# Patient Record
Sex: Male | Born: 1945 | Race: Black or African American | Hispanic: No | Marital: Married
Health system: Southern US, Community
[De-identification: ages and names within clinical notes are randomized; demographics above are authoritative.]

## PROBLEM LIST (undated history)

## (undated) DIAGNOSIS — I10 Essential (primary) hypertension: Secondary | ICD-10-CM

---

## 2018-04-23 ENCOUNTER — Emergency Department (HOSPITAL_COMMUNITY): Payer: Medicare Other

## 2018-04-23 ENCOUNTER — Encounter (HOSPITAL_COMMUNITY): Payer: Self-pay | Admitting: Emergency Medicine

## 2018-04-23 ENCOUNTER — Emergency Department (HOSPITAL_COMMUNITY)
Admission: EM | Admit: 2018-04-23 | Discharge: 2018-04-23 | Disposition: A | Discharge status: Home / Self Care | Payer: Medicare Other | Attending: Emergency Medicine | Admitting: Emergency Medicine

## 2018-04-23 DIAGNOSIS — W19XXXA Unspecified fall, initial encounter: Secondary | ICD-10-CM | POA: Diagnosis not present

## 2018-04-23 DIAGNOSIS — Y9289 Other specified places as the place of occurrence of the external cause: Secondary | ICD-10-CM | POA: Diagnosis not present

## 2018-04-23 DIAGNOSIS — I712 Thoracic aortic aneurysm, without rupture: Secondary | ICD-10-CM | POA: Diagnosis not present

## 2018-04-23 DIAGNOSIS — S7002XA Contusion of left hip, initial encounter: Secondary | ICD-10-CM | POA: Insufficient documentation

## 2018-04-23 DIAGNOSIS — S299XXA Unspecified injury of thorax, initial encounter: Secondary | ICD-10-CM | POA: Diagnosis present

## 2018-04-23 DIAGNOSIS — W100XXA Fall (on)(from) escalator, initial encounter: Secondary | ICD-10-CM

## 2018-04-23 DIAGNOSIS — S2242XA Multiple fractures of ribs, left side, initial encounter for closed fracture: Secondary | ICD-10-CM

## 2018-04-23 DIAGNOSIS — Y9389 Activity, other specified: Secondary | ICD-10-CM | POA: Diagnosis not present

## 2018-04-23 DIAGNOSIS — I10 Essential (primary) hypertension: Secondary | ICD-10-CM | POA: Insufficient documentation

## 2018-04-23 DIAGNOSIS — Y998 Other external cause status: Secondary | ICD-10-CM | POA: Insufficient documentation

## 2018-04-23 DIAGNOSIS — I7121 Aneurysm of the ascending aorta, without rupture: Secondary | ICD-10-CM

## 2018-04-23 HISTORY — DX: Essential (primary) hypertension: I10

## 2018-04-23 LAB — BASIC METABOLIC PANEL
Anion gap: 6 (ref 5–15)
BUN: 16 mg/dL (ref 8–23)
CALCIUM: 9.8 mg/dL (ref 8.9–10.3)
CO2: 25 mmol/L (ref 22–32)
Chloride: 111 mmol/L (ref 98–111)
Creatinine, Ser: 1.2 mg/dL (ref 0.61–1.24)
GFR calc Af Amer: >60 mL/min (ref >60)
GFR, EST NON AFRICAN AMERICAN: 59 mL/min — AB (ref >60)
GLUCOSE: 150 mg/dL — AB (ref 70–99)
Potassium: 3.6 mmol/L (ref 3.5–5.1)
Sodium: 142 mmol/L (ref 135–145)

## 2018-04-23 LAB — CBC WITH DIFFERENTIAL/PLATELET
ABS IMMATURE GRANULOCYTES: 0.02 10*3/uL (ref 0.00–0.07)
BASOS PCT: 0 %
Basophils Absolute: 0 10*3/uL (ref 0.0–0.1)
EOS ABS: 0.1 10*3/uL (ref 0.0–0.5)
Eosinophils Relative: 2 %
HEMATOCRIT: 35 % — AB (ref 39.0–52.0)
Hemoglobin: 11.5 g/dL — ABNORMAL LOW (ref 13.0–17.0)
IMMATURE GRANULOCYTES: 0 %
LYMPHS ABS: 1.8 10*3/uL (ref 0.7–4.0)
Lymphocytes Relative: 22 %
MCH: 30.3 pg (ref 26.0–34.0)
MCHC: 32.9 g/dL (ref 30.0–36.0)
MCV: 92.1 fL (ref 80.0–100.0)
MONO ABS: 0.7 10*3/uL (ref 0.1–1.0)
MONOS PCT: 9 %
Neutro Abs: 5.4 10*3/uL (ref 1.7–7.7)
Neutrophils Relative %: 67 %
PLATELETS: 173 10*3/uL (ref 150–400)
RBC: 3.8 MIL/uL — ABNORMAL LOW (ref 4.22–5.81)
RDW: 12.1 % (ref 11.5–15.5)
WBC: 8.1 10*3/uL (ref 4.0–10.5)
nRBC: 0 % (ref 0.0–0.2)

## 2018-04-23 MED ORDER — HYDROCODONE-ACETAMINOPHEN 5-325 MG PO TABS: 1.0000 | ORAL_TABLET | ORAL | 0 refills | Status: DC | PRN

## 2018-04-23 MED ORDER — HYDROCODONE-ACETAMINOPHEN 5-325 MG PO TABS: 1.0000 | ORAL_TABLET | ORAL | 0 refills | Status: AC | PRN

## 2018-04-23 MED ORDER — IOHEXOL 300 MG/ML  SOLN
100.0000 mL | Freq: Once | INTRAMUSCULAR | Status: AC | PRN
  Administered 2018-04-23: 100 mL via INTRAVENOUS

## 2018-04-23 MED ORDER — HYDROCODONE-ACETAMINOPHEN 5-325 MG PO TABS
1.0000 | ORAL_TABLET | Freq: Once | ORAL | Status: AC
  Administered 2018-04-23: 1 via ORAL
  Filled 2018-04-23: qty 1

## 2018-04-23 MED FILL — HYDROCODON-APAP 5-325: 5-325 | 2 days supply | Qty: 20 | Fill #0

## 2018-04-23 NOTE — ED Notes (Addendum)
Pt and wife verbalized understanding of discharge paperwork, prescriptions and follow-up care. Pt waiting on walker to be delivered before discharging.

## 2018-04-23 NOTE — ED Notes (Signed)
Pt given walker and waiting for family to pick up. Pt taken to discharge lounge upstairs while waiting for out of town family.

## 2018-04-23 NOTE — ED Notes (Signed)
Pt. To XRAY via stretcher. 

## 2018-04-23 NOTE — ED Provider Notes (Signed)
[facility] EMERGENCY DEPARTMENT Provider Note   CSN: [id number] Arrival date & time: 04/23/18  0209     History   Chief Complaint Chief Complaint  Patient presents with  . Fall    HPI Troy Riley is a 72 y.o. male.  The history is provided by the patient and the spouse.  Fall   He has a history of hypertension, and comes in following 2 falls.  He was at [facility] station in [place] and fell going down the escalator.  He suffered injury to his lower sternal area and left hip area.  He has been unable to stand because of pain.  He rates pain at 8/10.  He denies head or neck injury and denies loss of consciousness.  He is not on any anticoagulants.  Past Medical History:  Diagnosis Date  . Hypertension     There are no active problems to display for this patient.   History reviewed. No pertinent surgical history.   Home Medications    Prior to Admission medications   Not on File    Family History No family history on file.  Social History Social History   Tobacco Use  . Smoking status: Not on file  Substance Use Topics  . Alcohol use: Not on file  . Drug use: Not on file     Allergies   Patient has no known allergies.   Review of Systems Review of Systems  All other systems reviewed and are negative.    Physical Exam Updated Vital Signs BP (!) 149/83 (BP Location: Right Arm)   Pulse 76   Temp 99.4 F (37.4 C) (Oral)   Resp (!) 21   SpO2 98%   Physical Exam  Nursing note and vitals reviewed.  72 year old male, resting comfortably and in no acute distress. Vital signs are significant for elevated blood pressure. Oxygen saturation is 98%, which is normal. Head is normocephalic and atraumatic. PERRLA, EOMI. Oropharynx is clear. Neck is nontender and supple without adenopathy or JVD. Back is nontender and there is no CVA tenderness. Lungs are clear without rales, wheezes, or rhonchi. Chest is moderately tender over the lower  sternal area and over the left lateral rib cage.  There is no crepitus. Heart has regular rate and rhythm without murmur. Abdomen is soft, flat, nontender without masses or hepatosplenomegaly and peristalsis is normoactive.  Pelvis is stable.  There is an ecchymosis and tenderness over the left lateral pelvic area. Extremities have no cyanosis or edema, full range of motion is present. Skin is warm and dry without rash. Neurologic: Mental status is normal, cranial nerves are intact, there are no motor or sensory deficits.  ED Treatments / Results  Labs (all labs ordered are listed, but only abnormal results are displayed) Labs Reviewed  BASIC METABOLIC PANEL - Abnormal; Notable for the following components:      Result Value   Glucose, Bld 150 (*)    GFR calc non Af Amer 59 (*)    All other components within normal limits  CBC WITH DIFFERENTIAL/PLATELET - Abnormal; Notable for the following components:   RBC 3.80 (*)    Hemoglobin 11.5 (*)    HCT 35.0 (*)    All other components within normal limits   Radiology Ct Chest W Contrast  Result Date: 04/23/2018 CLINICAL DATA:  72 y/o  M; fell on escalator. Left-sided pain. EXAM: CT CHEST, ABDOMEN, AND PELVIS WITH CONTRAST TECHNIQUE: Multidetector CT imaging of the chest,  abdomen and pelvis was performed following the standard protocol during bolus administration of intravenous contrast. CONTRAST:  OMNIPAQUE IOHEXOL 300 MG/ML  SOLN COMPARISON:  None. FINDINGS: CT CHEST FINDINGS Cardiovascular: 4.2 cm ascending aorta (series 6, image 54). Normal caliber main pulmonary artery. Mild aortic and moderate coronary artery calcific atherosclerosis. Mild cardiomegaly. No pericardial effusion. Mediastinum/Nodes: Nodules within the left lobe of the thyroid measuring up to 10 mm. No mediastinal lymphadenopathy or axillary lymphadenopathy. Patent central airways. Normal appearance of the esophagus. Small hiatal hernia. Lungs/Pleura: Lungs are clear. No  pleural effusion or pneumothorax. Musculoskeletal: Right paramedian upper back 21 mm dermal cyst. Left 6 in 7 lateral rib minimally displaced acute fractures. Mild multilevel discogenic degenerative changes of the thoracic spine. CT ABDOMEN PELVIS FINDINGS Hepatobiliary: No hepatic injury or perihepatic hematoma. Gallbladder is unremarkable Pancreas: Unremarkable. No pancreatic ductal dilatation or surrounding inflammatory changes. Spleen: No splenic injury or perisplenic hematoma. Adrenals/Urinary Tract: No adrenal hemorrhage or renal injury identified. Bladder is unremarkable. Stomach/Bowel: Stomach is within normal limits. Appendix appears normal. No evidence of bowel wall thickening, distention, or inflammatory changes. Extensive pan colonic diverticulosis. Vascular/Lymphatic: Aortic atherosclerosis. No enlarged abdominal or pelvic lymph nodes. Reproductive: Mild prostate enlargement. Other: No abdominal wall hernia or abnormality. No abdominopelvic ascites. Musculoskeletal: Contusion to the superficial soft tissues of the left hip with a 5.5 x 2.5 x 7.7 cm (volume = 55 cm^3) hematoma (series 3, image 111). Lumbar spondylosis greatest at L4-5 and L5-S1 levels with there is advanced facet arthropathy. IMPRESSION: 1. Left 6 in 7 lateral rib minimally displaced acute fractures. No pneumothorax. 2. Contusion to the superficial soft tissues of the left hip with a hematoma measuring up to 7.7 cm, 55 CC. 3. No additional acute fracture or internal injury identified. 4. 4.2 cm ascending aortic aneurysm. Recommend annual imaging followup by CTA or MRA. This recommendation follows 2010 ACCF/AHA/AATS/ACR/ASA/SCA/SCAI/SIR/STS/SVM Guidelines for the Diagnosis and Management of Patients with Thoracic Aortic Disease. 2010; 121: Z610-R604. 5. Small hiatal hernia. 6. Extensive pan colonic diverticulosis. Electronically Signed   By: Mitzi Hansen M.D.   On: 04/23/2018 06:23   Ct Abdomen Pelvis W Contrast  Result  Date: 04/23/2018 CLINICAL DATA:  72 y/o  M; fell on escalator. Left-sided pain. EXAM: CT CHEST, ABDOMEN, AND PELVIS WITH CONTRAST TECHNIQUE: Multidetector CT imaging of the chest, abdomen and pelvis was performed following the standard protocol during bolus administration of intravenous contrast. CONTRAST:  OMNIPAQUE IOHEXOL 300 MG/ML  SOLN COMPARISON:  None. FINDINGS: CT CHEST FINDINGS Cardiovascular: 4.2 cm ascending aorta (series 6, image 54). Normal caliber main pulmonary artery. Mild aortic and moderate coronary artery calcific atherosclerosis. Mild cardiomegaly. No pericardial effusion. Mediastinum/Nodes: Nodules within the left lobe of the thyroid measuring up to 10 mm. No mediastinal lymphadenopathy or axillary lymphadenopathy. Patent central airways. Normal appearance of the esophagus. Small hiatal hernia. Lungs/Pleura: Lungs are clear. No pleural effusion or pneumothorax. Musculoskeletal: Right paramedian upper back 21 mm dermal cyst. Left 6 in 7 lateral rib minimally displaced acute fractures. Mild multilevel discogenic degenerative changes of the thoracic spine. CT ABDOMEN PELVIS FINDINGS Hepatobiliary: No hepatic injury or perihepatic hematoma. Gallbladder is unremarkable Pancreas: Unremarkable. No pancreatic ductal dilatation or surrounding inflammatory changes. Spleen: No splenic injury or perisplenic hematoma. Adrenals/Urinary Tract: No adrenal hemorrhage or renal injury identified. Bladder is unremarkable. Stomach/Bowel: Stomach is within normal limits. Appendix appears normal. No evidence of bowel wall thickening, distention, or inflammatory changes. Extensive pan colonic diverticulosis. Vascular/Lymphatic: Aortic atherosclerosis. No enlarged abdominal or pelvic lymph  nodes. Reproductive: Mild prostate enlargement. Other: No abdominal wall hernia or abnormality. No abdominopelvic ascites. Musculoskeletal: Contusion to the superficial soft tissues of the left hip with a 5.5 x 2.5 x 7.7 cm  (volume = 55 cm^3) hematoma (series 3, image 111). Lumbar spondylosis greatest at L4-5 and L5-S1 levels with there is advanced facet arthropathy. IMPRESSION: 1. Left 6 in 7 lateral rib minimally displaced acute fractures. No pneumothorax. 2. Contusion to the superficial soft tissues of the left hip with a hematoma measuring up to 7.7 cm, 55 CC. 3. No additional acute fracture or internal injury identified. 4. 4.2 cm ascending aortic aneurysm. Recommend annual imaging followup by CTA or MRA. This recommendation follows 2010 ACCF/AHA/AATS/ACR/ASA/SCA/SCAI/SIR/STS/SVM Guidelines for the Diagnosis and Management of Patients with Thoracic Aortic Disease. 2010; 121: Z610-[id number]. 5. Small hiatal hernia. 6. Extensive pan colonic diverticulosis. Electronically Signed   By: Mitzi HansenLance  Furusawa-Stratton M.D.   On: 04/23/2018 06:23   Dg Hip Unilat W Or Wo Pelvis 2-3 Views Left  Result Date: 04/23/2018 CLINICAL DATA:  Left hip pain after a fall today. EXAM: DG HIP (WITH OR WITHOUT PELVIS) 2-3V LEFT COMPARISON:  None. FINDINGS: Pelvis and left hip appear intact. No evidence of acute fracture or dislocation of the left hip. SI joints and symphysis pubis are not displaced. Degenerative changes in the left hip with prominent osteophyte formation. Degenerative changes in the lower lumbar spine and right hip is well. IMPRESSION: Degenerative changes in the left hip. No acute bony abnormalities. Electronically Signed   By: Burman NievesWilliam  Stevens M.D.   On: 04/23/2018 03:31    Procedures Procedures   Medications Ordered in ED Medications  iohexol (OMNIPAQUE) 300 MG/ML solution 100 mL (100 mLs Intravenous Contrast Given 04/23/18 0529)  HYDROcodone-acetaminophen (NORCO/VICODIN) 5-325 MG per tablet 1 tablet (1 tablet Oral Given 04/23/18 [id number])     Initial Impression / Assessment and Plan / ED Course  I have reviewed the triage vital signs and the nursing notes.  Pertinent labs & imaging results that were available during my care of  the patient were reviewed by me and considered in my medical decision making (see chart for details).  Fall with injury to lower chest and left lateral hip.  Will send for CT of chest, abdomen, pelvis, and as well as x-rays of left hip.  Hip x-rays show no fracture.  CT scan shows fractures of 2 ribs on the left as well as hematoma in the soft tissues lateral to the left hip.  Incidental finding of 4.2 cm a sending aortic aneurysm.  He is given a walker and is given prescription for hydrocodone-acetaminophen for pain.  He is advised of the radiologist recommendation for annual screening with CT angiogram or magnetic resonance angiogram.  Final Clinical Impressions(s) / ED Diagnoses   Final diagnoses:  Fall (on)(from) escalator, initial encounter  Closed fracture of two ribs of left side, initial encounter  Contusion of left hip, initial encounter  Aneurysm, ascending aorta [facility](HCC)    ED Discharge Orders         Ordered    HYDROcodone-acetaminophen (NORCO) 5-325 MG tablet  Every 4 hours PRN,   Status:  Discontinued     04/23/18 0710    HYDROcodone-acetaminophen (NORCO) 5-325 MG tablet  Every 4 hours PRN     04/23/18 0715           Dione BoozeGlick, Briahnna Harries, MD 04/23/18 [phone]

## 2018-04-23 NOTE — Progress Notes (Signed)
CSW notified by RN that pt is in need of a walker and is ready for discharge. CSW verbalized that CSW would update RNCM once arrived for this need.    Claude MangesKierra S. Lyssa Hackley, MSW, LCSW-A Emergency Department Clinical Social Worker [phone]

## 2018-04-23 NOTE — Discharge Planning (Signed)
[facility]  EDCM contacted [facility] liaison to deliver rolling walker to pt.

## 2018-04-23 NOTE — ED Triage Notes (Signed)
Per EMS[place]  Before he getting on the train he had a mechanical fall on the esclator, causing pain to his left arm and hip.  While traveling on the train he tried to get up and due to the pain on the left leg he fell again.  No LOC, not on blood thinner.

## 2018-04-23 NOTE — Discharge Instructions (Addendum)
Apply ice for thirty minutes at a time, four times a day.  Take ibuprofen as needed for additional pain relief.  Your CT scan showed you have an aneurysm of your aorta in your chest that measures 4.2 cm. You need to have that checked once a year - either with a CT angiogram (CTA) or magnetic resonance angiogram (MRA). Your primary care doctor or your cardiologist can set this up.

## 2019-12-09 IMAGING — CR DG HIP (WITH OR WITHOUT PELVIS) 2-3V*L*
3 series · 3 of 3 positions shown · non-contrast
Comparison: None.

CLINICAL DATA: Left hip pain after a fall today.

EXAM:
DG HIP (WITH OR WITHOUT PELVIS) 2-3V LEFT

[pelvis ap]
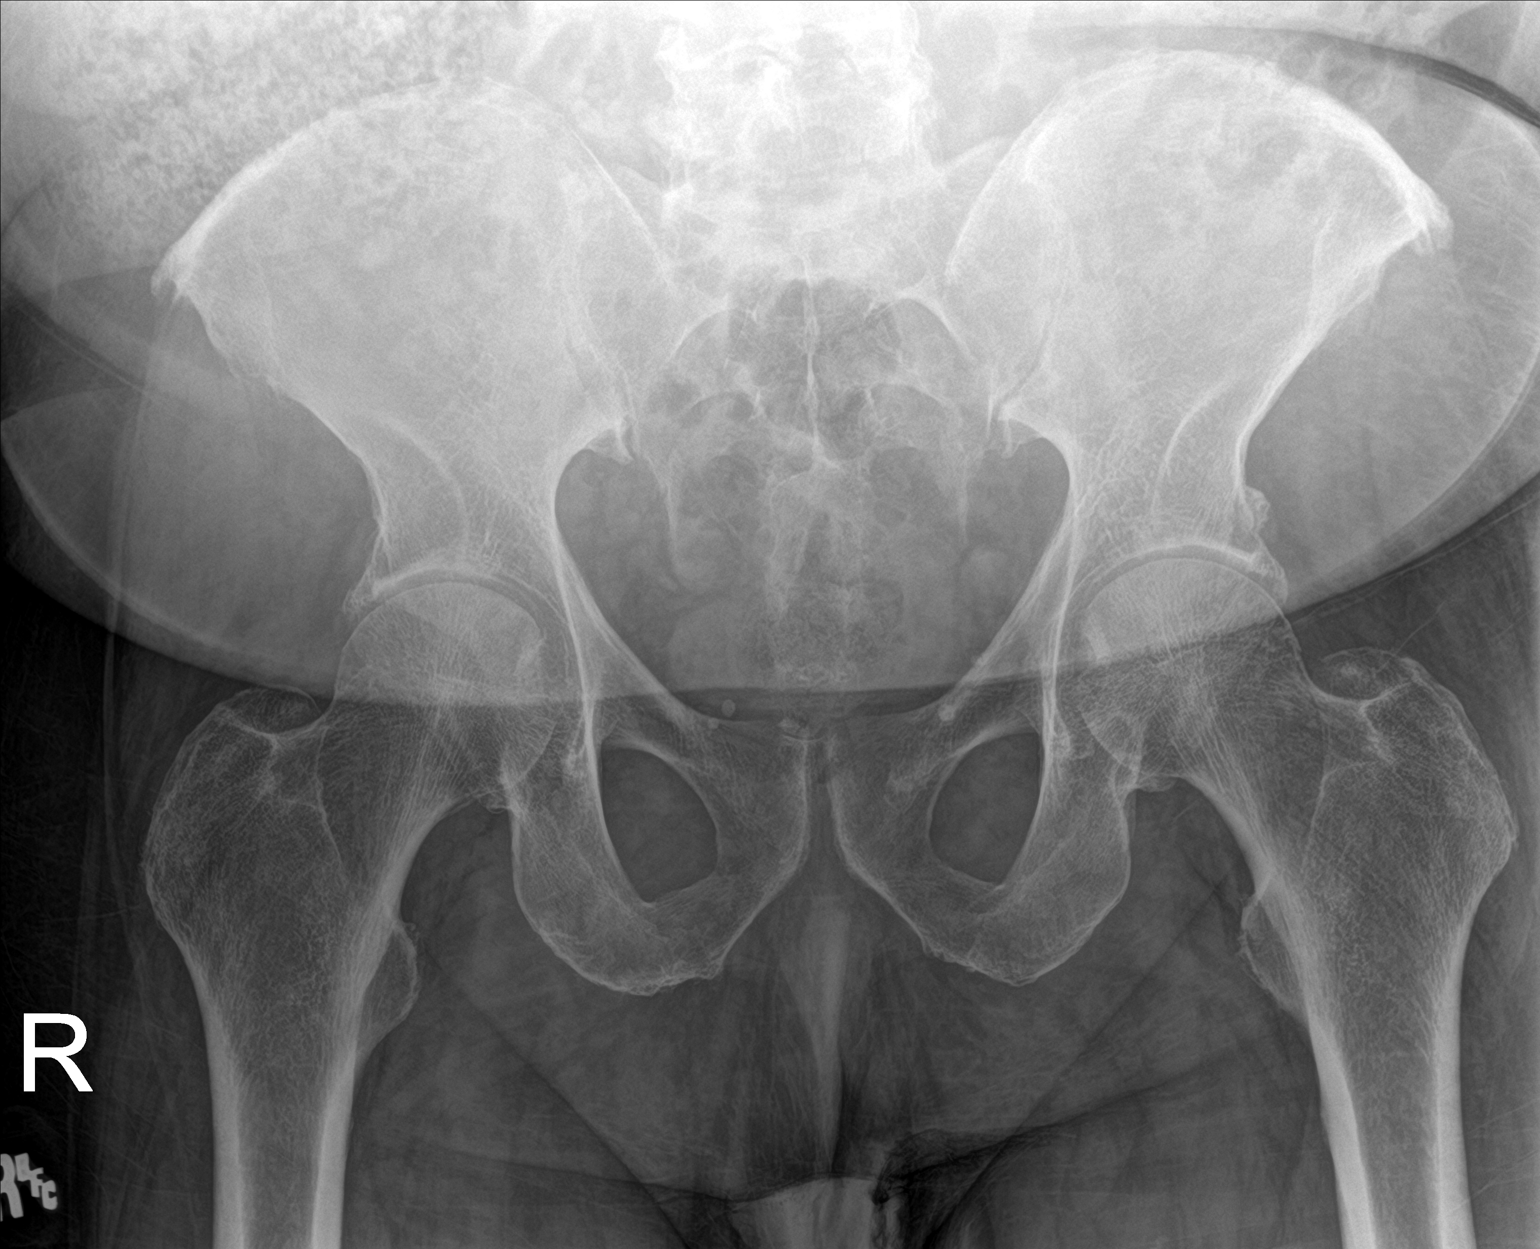

[hip ap]
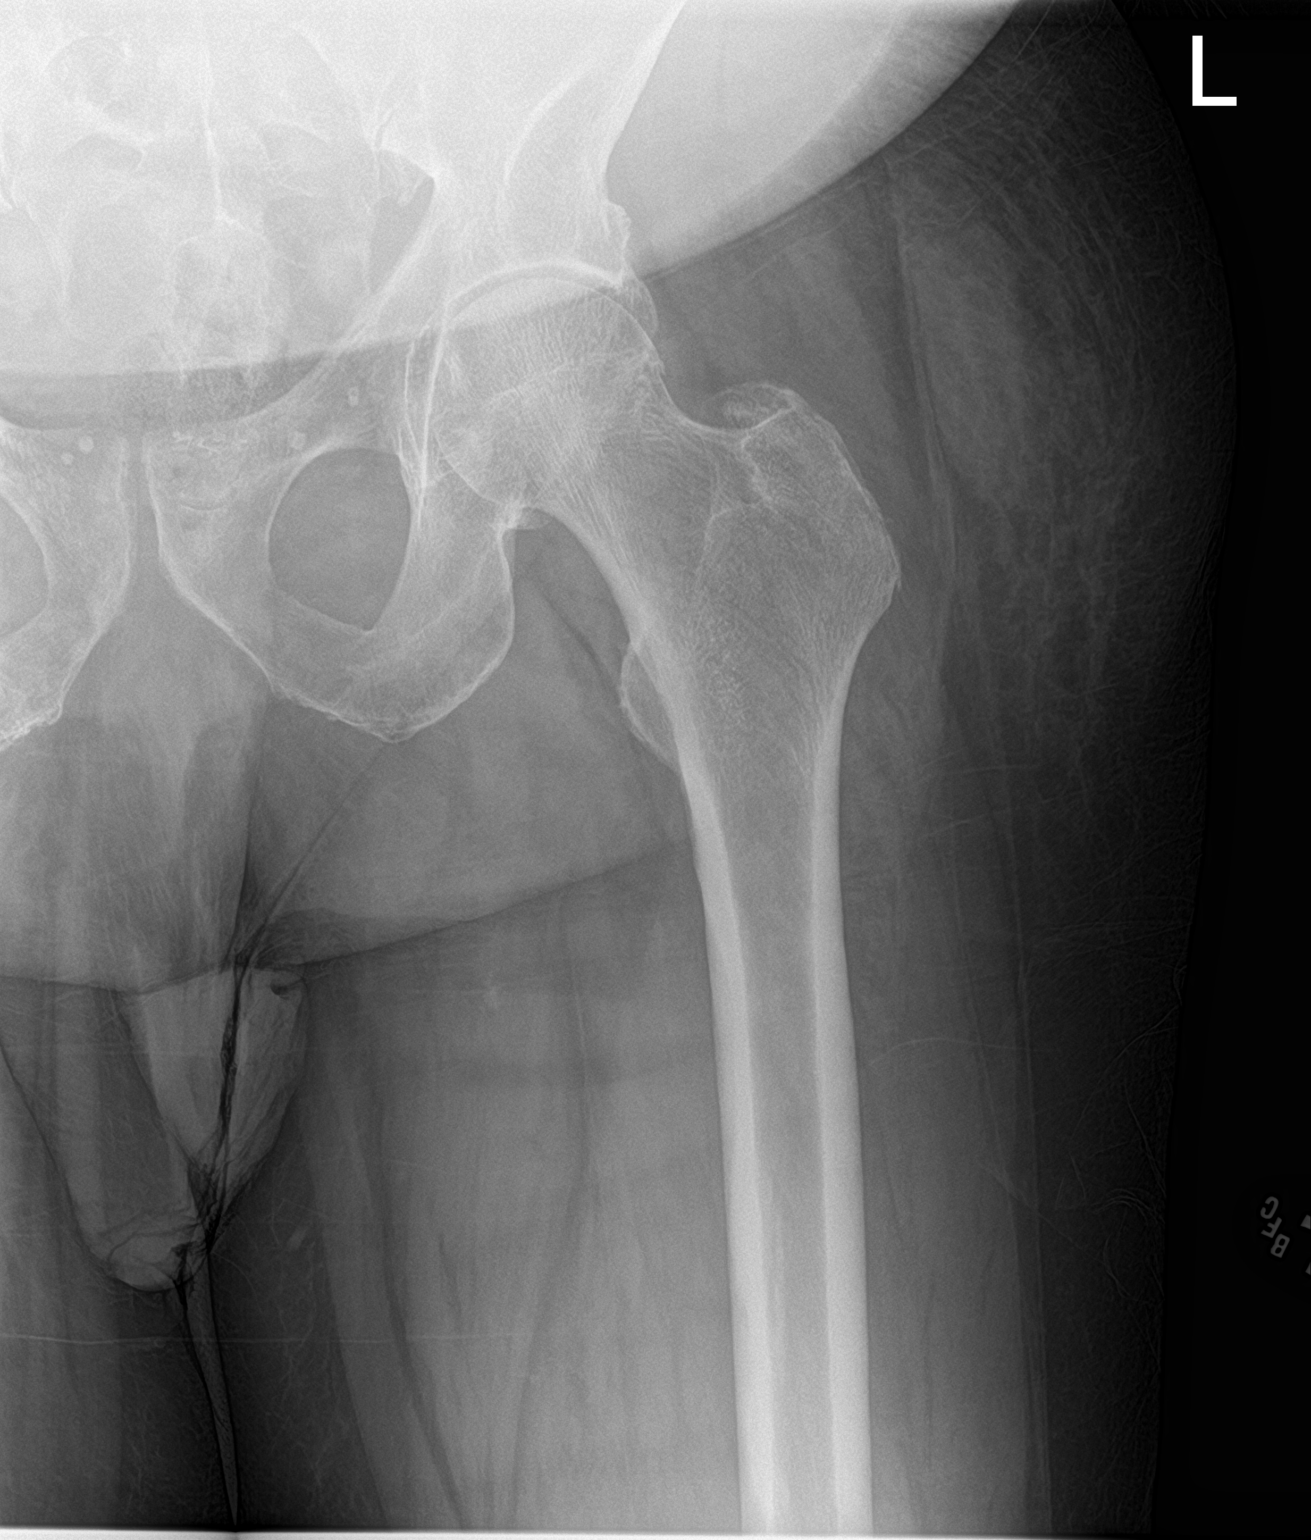

[hip lat]
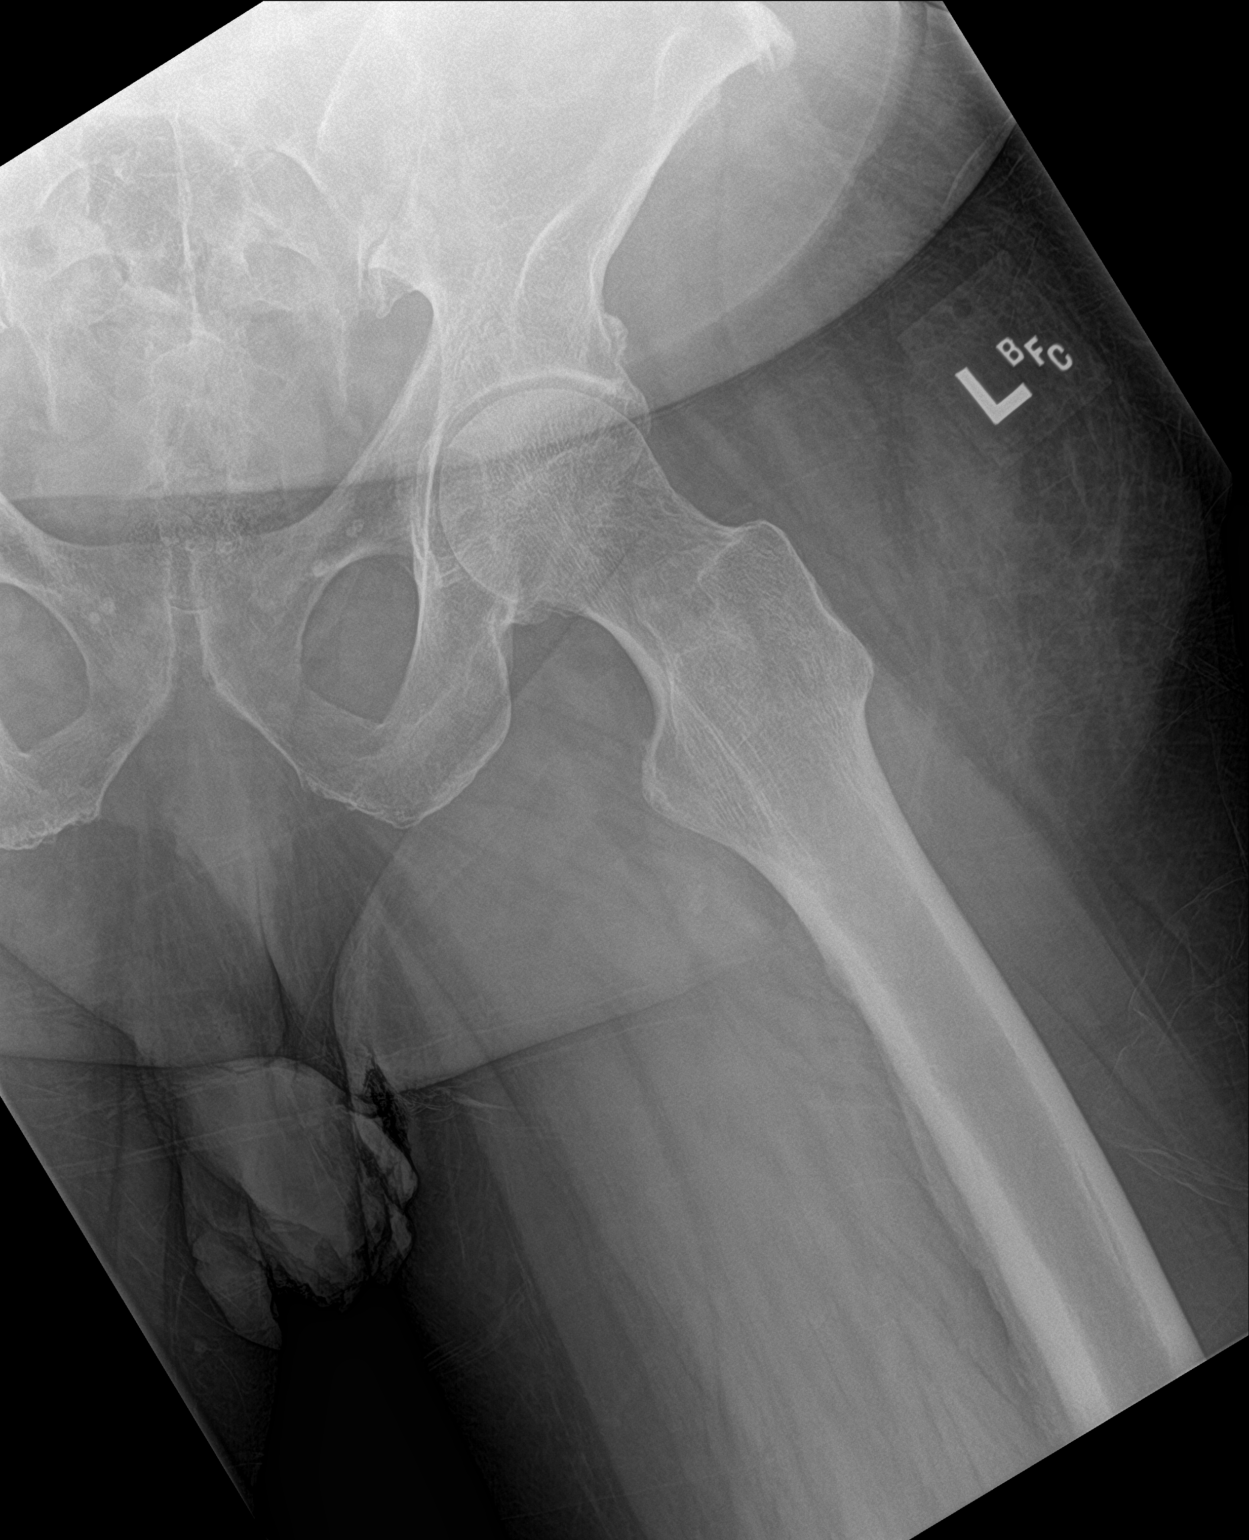

[3 of 3 positions shown; findings below may reference images not displayed]

FINDINGS: Pelvis and left hip appear intact. No evidence of acute fracture or
dislocation of the left hip. SI joints and symphysis pubis are not
displaced. Degenerative changes in the left hip with prominent
osteophyte formation. Degenerative changes in the lower lumbar spine
and right hip is well.
IMPRESSION: Degenerative changes in the left hip. No acute bony abnormalities.
